# Patient Record
Sex: Female | Born: 1990 | Hispanic: No | Marital: Single | Smoking: Never smoker
Health system: Southern US, Community
[De-identification: ages and names within clinical notes are randomized; demographics above are authoritative.]

## PROBLEM LIST (undated history)

## (undated) ENCOUNTER — Inpatient Hospital Stay (HOSPITAL_COMMUNITY): Payer: Managed Care, Other (non HMO)

---
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
## 2012-10-11 ENCOUNTER — Encounter: Payer: Managed Care, Other (non HMO) | Admitting: Family Medicine

## 2012-10-14 ENCOUNTER — Ambulatory Visit (INDEPENDENT_AMBULATORY_CARE_PROVIDER_SITE_OTHER): Payer: Managed Care, Other (non HMO) | Admitting: Family Medicine

## 2012-10-14 ENCOUNTER — Encounter: Payer: Self-pay | Admitting: Family Medicine

## 2012-10-14 ENCOUNTER — Encounter: Payer: Managed Care, Other (non HMO) | Admitting: Family Medicine

## 2012-10-14 VITALS — BP 100/68 | HR 88 | Temp 99.1°F | Ht 68.75 in | Wt 169.4 lb

## 2012-10-14 DIAGNOSIS — Z3009 Encounter for other general counseling and advice on contraception: Secondary | ICD-10-CM

## 2012-10-14 DIAGNOSIS — Z9189 Other specified personal risk factors, not elsewhere classified: Secondary | ICD-10-CM

## 2012-10-14 DIAGNOSIS — Z202 Contact with and (suspected) exposure to infections with a predominantly sexual mode of transmission: Secondary | ICD-10-CM

## 2012-10-14 DIAGNOSIS — Z0000 Encounter for general adult medical examination without abnormal findings: Secondary | ICD-10-CM

## 2012-10-14 DIAGNOSIS — Z30011 Encounter for initial prescription of contraceptive pills: Secondary | ICD-10-CM

## 2012-10-14 DIAGNOSIS — K130 Diseases of lips: Secondary | ICD-10-CM

## 2012-10-14 DIAGNOSIS — G4700 Insomnia, unspecified: Secondary | ICD-10-CM | POA: Insufficient documentation

## 2012-10-14 MED ORDER — NORGESTIMATE-ETH ESTRADIOL 0.25-35 MG-MCG PO TABS: 1.0000 | ORAL_TABLET | Freq: Every day | ORAL | Status: DC

## 2012-10-14 NOTE — Progress Notes (Signed)
  Subjective:    Patient ID: Virginia Alvarado, female    DOB: 21-Aug-1990, 22 y.o.   MRN: [id number]  HPI CPE: not sleeping well- trouble falling asleep.  Pt reports she thought she had an appt at 8:30 so was up all night.  Went to bed at 10am and was up at 2pm.  Has not tried anything for sleep.  sxs started ~1 month.  Hyperpigmented areas in corners of mouth- only occurs in July.  Was seen for this last year and told to use clotrimazole.  Didn't feel it was effective.  Only using once daily.     Review of Systems Patient reports no vision/ hearing changes, adenopathy,fever, weight change,  persistant/recurrent hoarseness , swallowing issues, chest pain, palpitations, edema, persistant/recurrent cough, hemoptysis, dyspnea (rest/exertional/paroxysmal nocturnal), gastrointestinal bleeding (melena, rectal bleeding), abdominal pain, significant heartburn, bowel changes, GU symptoms (dysuria, hematuria, incontinence), Gyn symptoms (abnormal  bleeding, pain),  syncope, focal weakness, memory loss, numbness & tingling, skin/hair/nail changes, abnormal bruising or bleeding, anxiety, or depression.     Objective:   Physical Exam  General Appearance:    Alert, cooperative, no distress, appears stated age  Head:    Normocephalic, without obvious abnormality, atraumatic  Eyes:    PERRL, conjunctiva/corneas clear, EOM's intact, fundi    benign, both eyes  Ears:    Normal TM's and external ear canals, both ears  Nose:   Nares normal, septum midline, mucosa normal, no drainage    or sinus tenderness  Throat:   Angular cheilits, mucosa and tongue normal; teeth and gums normal  Neck:   Supple, symmetrical, trachea midline, no adenopathy;    Thyroid: no enlargement/tenderness/nodules  Back:     Symmetric, no curvature, ROM normal, no CVA tenderness  Lungs:     Clear to auscultation bilaterally, respirations unlabored  Chest Wall:    No tenderness or deformity   Heart:    Regular rate and rhythm, S1 and S2  normal, no murmur, rub   or gallop  Breast Exam:    No tenderness, masses, or nipple abnormality  Abdomen:     Soft, non-tender, bowel sounds active all four quadrants,    no masses, no organomegaly  Genitalia:    deferred  Rectal:    Extremities:   Extremities normal, atraumatic, no cyanosis or edema  Pulses:   2+ and symmetric all extremities  Skin:   Skin color, texture, turgor normal, no rashes or lesions  Lymph nodes:   Cervical, supraclavicular, and axillary nodes normal  Neurologic:   CNII-XII intact, normal strength, sensation and reflexes    throughout          Assessment & Plan:

## 2012-10-14 NOTE — Assessment & Plan Note (Signed)
New.  Reviewed appropriate start date and importance of taking pills regularly.

## 2012-10-14 NOTE — Assessment & Plan Note (Signed)
Pt's PE WNL.  Check labs including those for STD exposure.  Cautioned pt to use condoms w/ each encounter.  Anticipatory guidance provided.

## 2012-10-14 NOTE — Patient Instructions (Addendum)
Follow up in 1 year or as needed We'll notify you of your lab results and make any changes if needed Start Melatonin as directed for your insomnia- this will help regulate the sleep/wake cycle Start the birth control pills the Sunday after you next bleed Use the Clotrimazole twice daily Call with any questions or concerns Good Luck in School!

## 2012-10-14 NOTE — Assessment & Plan Note (Signed)
Reviewed importance of antifungal use twice daily

## 2012-10-14 NOTE — Assessment & Plan Note (Signed)
New.  Start melatonin as it seems that pt's sleep/wake cycle is off schedule.  Will follow.

## 2012-10-15 LAB — HEPATIC FUNCTION PANEL
Albumin: 4.4 g/dL (ref 3.5–5.2)
Alkaline Phosphatase: 50 U/L (ref 39–117)
Total Bilirubin: 0.7 mg/dL (ref 0.3–1.2)

## 2012-10-15 LAB — LIPID PANEL
HDL: 64.6 mg/dL (ref >39.00)
Total CHOL/HDL Ratio: 2
Triglycerides: 65 mg/dL (ref 0.0–149.0)
VLDL: 13 mg/dL (ref 0.0–40.0)

## 2012-10-15 LAB — GC/CHLAMYDIA PROBE AMP, URINE
Chlamydia, Swab/Urine, PCR: POSITIVE — AB
GC Probe Amp, Urine: NEGATIVE

## 2012-10-15 LAB — CBC WITH DIFFERENTIAL/PLATELET
Basophils Absolute: 0 10*3/uL (ref 0.0–0.1)
Eosinophils Absolute: 0.1 10*3/uL (ref 0.0–0.7)
Eosinophils Relative: 2.7 % (ref 0.0–5.0)
HCT: 36.5 % (ref 36.0–46.0)
Lymphs Abs: 1.8 10*3/uL (ref 0.7–4.0)
MCHC: 31.8 g/dL (ref 30.0–36.0)
MCV: 82 fl (ref 78.0–100.0)
Monocytes Absolute: 0.2 10*3/uL (ref 0.1–1.0)
Neutrophils Relative %: 57.3 % (ref 43.0–77.0)
Platelets: 252 10*3/uL (ref 150.0–400.0)
RDW: 15 % — ABNORMAL HIGH (ref 11.5–14.6)

## 2012-10-15 LAB — BASIC METABOLIC PANEL
Calcium: 9.7 mg/dL (ref 8.4–10.5)
GFR: 83.29 mL/min (ref >60.00)
Glucose, Bld: 85 mg/dL (ref 70–99)
Potassium: 3.8 mEq/L (ref 3.5–5.1)
Sodium: 136 mEq/L (ref 135–145)

## 2012-10-15 LAB — RPR

## 2012-10-18 ENCOUNTER — Telehealth: Payer: Self-pay | Admitting: Family Medicine

## 2012-10-18 MED ORDER — AZITHROMYCIN 500 MG PO TABS: 1000.0000 mg | ORAL_TABLET | Freq: Once | ORAL | Status: DC

## 2012-10-18 NOTE — Telephone Encounter (Signed)
Message copied by Verdie Shire on Fri Oct 18, 2012  5:01 PM ------      Message from: Sheliah Hatch      Created: Tue Oct 15, 2012  9:13 PM       Labs look good w/ exception of + chlamydia.  Needs Azithromycin 500mg - 2 tabs at same time (1000mg  total) x1 dose.  Needs to avoid sex x7 days and partner needs to be notified.  Please report to health dept ------

## 2012-10-18 NOTE — Telephone Encounter (Signed)
Spoke with the pt and informed her of recent lab results and note.  Pt understood and agreed.  New rx sent to the pharmacy by e-script.  Reported to Health Dept.//AB/CMA

## 2012-10-18 NOTE — Telephone Encounter (Signed)
Patient says that she is returning a missed call from Angie in regards to her lab results.

## 2012-11-15 ENCOUNTER — Telehealth: Payer: Self-pay | Admitting: General Practice

## 2012-11-15 NOTE — Telephone Encounter (Signed)
Pt called stating that her birth control is causing severe eczema outbreaks and is having painful abdominal cramping. Med removed from Rx list per pt preference.

## 2012-12-06 ENCOUNTER — Telehealth: Payer: Self-pay | Admitting: Family Medicine

## 2012-12-06 NOTE — Telephone Encounter (Signed)
Refill: Sprintec 28 day tablet 0.25-35 mg-mcg. 90 day supply.

## 2012-12-10 NOTE — Telephone Encounter (Signed)
Pt was having side effects from this medication. Medication was removed from patient medication list per patient request.  Left message on VM to return call to  Clarify that she is no longer taking medication. SW, CMA

## 2013-01-23 ENCOUNTER — Other Ambulatory Visit: Payer: Self-pay

## 2013-02-27 ENCOUNTER — Encounter: Payer: Self-pay | Admitting: Internal Medicine

## 2013-02-27 ENCOUNTER — Ambulatory Visit (INDEPENDENT_AMBULATORY_CARE_PROVIDER_SITE_OTHER): Payer: Managed Care, Other (non HMO) | Admitting: Internal Medicine

## 2013-02-27 VITALS — BP 185/85 | HR 104 | Temp 98.0°F | Resp 14 | Wt 170.4 lb

## 2013-02-27 DIAGNOSIS — R6883 Chills (without fever): Secondary | ICD-10-CM

## 2013-02-27 DIAGNOSIS — R05 Cough: Secondary | ICD-10-CM

## 2013-02-27 DIAGNOSIS — J209 Acute bronchitis, unspecified: Secondary | ICD-10-CM

## 2013-02-27 MED ORDER — HYDROCODONE-HOMATROPINE 5-1.5 MG/5ML PO SYRP: 5.0000 mL | ORAL_SOLUTION | Freq: Four times a day (QID) | ORAL | Status: DC | PRN

## 2013-02-27 MED ORDER — AZITHROMYCIN 250 MG PO TABS: ORAL_TABLET | ORAL | Status: DC

## 2013-02-27 NOTE — Progress Notes (Signed)
   Subjective:    Patient ID: Virginia Alvarado, female    DOB: 12-19-1990, 22 y.o.   MRN: [id number]  HPI   Symptoms began 02/18/13 as marked weakness and fatigue associated with chills and sweats. She is unsure as to whether she had any significant fever .As of 12/4 she developed a cough productive of yellow sputum. This was associated with marked hypersomnolence, arthralgias, and myalgias  She used Robitussin-DM and NyQuil w/o benefit.  Subsequently the cough has become nonproductive. She may have had some wheezing w/o dyspnea. She's had some dental pain.  She has no history of asthma and has never smoked.   Review of Systems She denies extrinsic symptoms of itchy, watery eyes, sneezing  She's had no frontal sinus pain, facial pain, nasal purulence, sore throat, otic pain, or otic discharge.       Objective:   Physical Exam  General appearance:good health ;well nourished; no acute distress or increased work of breathing is present.  No  lymphadenopathy about the head, neck, or axilla noted.   Eyes: No conjunctival inflammation or lid edema is present. There is no scleral icterus.  Ears:  External ear exam shows no significant lesions or deformities.  Otoscopic examination reveals clear canals, tympanic membranes are intact bilaterally without bulging, retraction, inflammation or discharge.  Nose:  External nasal examination shows no deformity or inflammation. Nasal mucosa are pink and moist without lesions or exudates. No septal dislocation or deviation.No obstruction to airflow.   Oral exam: Dental hygiene is good; lips and gums are healthy appearing.There is no oropharyngeal erythema or exudate noted.   Neck:  No deformities,  masses, or tenderness noted.      Heart:  Slight increased  rate and regular rhythm. S1 and S2 normal without gallop, murmur, click, rub or other extra sounds.  BP RECHECK 120/80  Lungs:Chest clear to auscultation; no wheezes, rhonchi,rales ,or rubs  present.No increased work of breathing.  Dry cough  Extremities:  No cyanosis, edema, or clubbing  noted    Skin: Slightly damp         Assessment & Plan:  #1 acute bronchitis w/o bronchospasm  Plan: See orders and recommendations

## 2013-02-27 NOTE — Patient Instructions (Signed)
  Followup as needed for your this acute issue. Please report any significant change in your symptoms. Carry room temperature water and sip liberally after coughing.

## 2013-02-27 NOTE — Addendum Note (Signed)
Addended by: Wende Mott on: 02/27/2013 03:50 PM   Modules accepted: Orders

## 2013-02-27 NOTE — Progress Notes (Signed)
Pre visit review using our clinic review tool, if applicable. No additional management support is needed unless otherwise documented below in the visit note. 

## 2013-03-27 ENCOUNTER — Ambulatory Visit (INDEPENDENT_AMBULATORY_CARE_PROVIDER_SITE_OTHER): Payer: Managed Care, Other (non HMO) | Admitting: Family Medicine

## 2013-03-27 ENCOUNTER — Encounter: Payer: Self-pay | Admitting: Family Medicine

## 2013-03-27 VITALS — BP 130/82 | HR 83 | Temp 98.2°F | Resp 17 | Wt 168.0 lb

## 2013-03-27 DIAGNOSIS — L309 Dermatitis, unspecified: Secondary | ICD-10-CM

## 2013-03-27 DIAGNOSIS — R059 Cough, unspecified: Secondary | ICD-10-CM

## 2013-03-27 DIAGNOSIS — R05 Cough: Secondary | ICD-10-CM

## 2013-03-27 DIAGNOSIS — R058 Other specified cough: Secondary | ICD-10-CM

## 2013-03-27 DIAGNOSIS — L259 Unspecified contact dermatitis, unspecified cause: Secondary | ICD-10-CM

## 2013-03-27 MED ORDER — PROMETHAZINE-DM 6.25-15 MG/5ML PO SYRP: 5.0000 mL | ORAL_SOLUTION | Freq: Four times a day (QID) | ORAL | Status: DC | PRN

## 2013-03-27 MED ORDER — BENZONATATE 200 MG PO CAPS: 200.0000 mg | ORAL_CAPSULE | Freq: Three times a day (TID) | ORAL | Status: DC | PRN

## 2013-03-27 MED ORDER — BETAMETHASONE VALERATE 0.1 % EX OINT: 1.0000 "application " | TOPICAL_OINTMENT | Freq: Two times a day (BID) | CUTANEOUS | Status: DC

## 2013-03-27 MED ORDER — PREDNISONE 10 MG PO TABS: ORAL_TABLET | ORAL | Status: DC

## 2013-03-27 NOTE — Progress Notes (Signed)
   Subjective:    Patient ID: Virginia Alvarado, female    DOB: 06/03/1990, 23 y.o.   MRN: [id number]  HPI URI- was tx'd on 12/11 for bronchitis.  Has had persistent cough since.  Pt reports feeling 'ok' w/ exception of cough.  Cough sounds like 'barking seal'.  Cough is rarely productive of mucous.  No fevers.  Taking echinacea, Vit C, tylenol severe cold.  No facial pain/pressure, sore throat, ear pain.  No known sick contacts.  Eczema- pt has been using triamcinolone 'since i was 9' w/out relief.     Review of Systems For ROS see HPI     Objective:   Physical Exam  Constitutional: She appears well-developed and well-nourished. No distress.  HENT:  Head: Normocephalic and atraumatic.  TMs normal bilaterally Mild nasal congestion Throat w/out erythema, edema, or exudate  Eyes: Conjunctivae and EOM are normal. Pupils are equal, round, and reactive to light.  Neck: Normal range of motion. Neck supple.  Cardiovascular: Normal rate, regular rhythm, normal heart sounds and intact distal pulses.   No murmur heard. Pulmonary/Chest: Effort normal and breath sounds normal. No respiratory distress. She has no wheezes.  + hacking cough  Lymphadenopathy:    She has no cervical adenopathy.  Skin: Skin is warm and dry.  Scattered eczematous patches          Assessment & Plan:

## 2013-03-27 NOTE — Assessment & Plan Note (Signed)
Chronic problem.  Deteriorated.  Increase potency of steroid ointment but cautioned against use on eyelids.  Reviewed supportive care and red flags that should prompt return.  Pt expressed understanding and is in agreement w/ plan.

## 2013-03-27 NOTE — Assessment & Plan Note (Signed)
New.  No evidence of infectious process.  Start prednisone for airway inflammation and cough meds prn.  Reviewed supportive care and red flags that should prompt return.  Pt expressed understanding and is in agreement w/ plan.

## 2013-03-27 NOTE — Patient Instructions (Signed)
Follow up as needed The cough is not infectious- it's caused by airway inflammation from your recent infection Drink plenty of fluids Use the cough pills for daytime and the syrup for night Take the prednisone as directed- take w/ food Use the Triamcinolone on the eyelids and the betamethasone on other areas Call with any questions or concerns Hang in there! Happy Belated Birthday!!!

## 2013-03-27 NOTE — Progress Notes (Signed)
Pre visit review using our clinic review tool, if applicable. No additional management support is needed unless otherwise documented below in the visit note. 

## 2013-07-04 ENCOUNTER — Ambulatory Visit (INDEPENDENT_AMBULATORY_CARE_PROVIDER_SITE_OTHER): Payer: Managed Care, Other (non HMO) | Admitting: *Deleted

## 2013-07-04 DIAGNOSIS — Z111 Encounter for screening for respiratory tuberculosis: Secondary | ICD-10-CM

## 2013-07-04 NOTE — Progress Notes (Signed)
PPD Placement note Jones SkeneAbiola Recktenwald, [age] y.o. female is here today for placement of PPD test Reason for PPD test: School Pt taken PPD test before: Yes Verified in allergy area and with patient that they are not allergic to the products PPD is made of (Phenol or Tween). Verified, no known allergies Is patient taking any oral or IV steroid medication now or have they taken it in the last month? No Has the patient ever received the BCG vaccine?: No Has the patient been in recent contact with anyone known or suspected of having active TB disease?: No      Date of exposure (if applicable):N/A      Name of person they were exposed to (if applicable): N/A Patient's [place] of origin[place] O: Alert and oriented in NAD. P:  PPD placed on 07/04/2013.  Patient advised to return for reading within 48-72 hours.

## 2013-07-07 ENCOUNTER — Ambulatory Visit (HOSPITAL_BASED_OUTPATIENT_CLINIC_OR_DEPARTMENT_OTHER)
Admission: RE | Admit: 2013-07-07 | Discharge: 2013-07-07 | Disposition: A | Discharge status: Home / Self Care | Payer: Managed Care, Other (non HMO) | Source: Ambulatory Visit | Attending: Family Medicine | Admitting: Family Medicine

## 2013-07-07 ENCOUNTER — Other Ambulatory Visit: Payer: Self-pay | Admitting: Family Medicine

## 2013-07-07 DIAGNOSIS — R7611 Nonspecific reaction to tuberculin skin test without active tuberculosis: Secondary | ICD-10-CM

## 2013-07-07 LAB — TB SKIN TEST
Induration: 5 mm
TB Skin Test: POSITIVE

## 2013-08-06 ENCOUNTER — Ambulatory Visit (INDEPENDENT_AMBULATORY_CARE_PROVIDER_SITE_OTHER): Payer: Managed Care, Other (non HMO) | Admitting: Family Medicine

## 2013-08-06 ENCOUNTER — Encounter: Payer: Self-pay | Admitting: Family Medicine

## 2013-08-06 VITALS — BP 110/80 | HR 110 | Temp 98.0°F | Resp 16 | Wt 171.2 lb

## 2013-08-06 DIAGNOSIS — L309 Dermatitis, unspecified: Secondary | ICD-10-CM

## 2013-08-06 DIAGNOSIS — L919 Hypertrophic disorder of the skin, unspecified: Secondary | ICD-10-CM

## 2013-08-06 DIAGNOSIS — L909 Atrophic disorder of skin, unspecified: Secondary | ICD-10-CM

## 2013-08-06 DIAGNOSIS — L918 Other hypertrophic disorders of the skin: Secondary | ICD-10-CM

## 2013-08-06 DIAGNOSIS — L242 Irritant contact dermatitis due to solvents: Secondary | ICD-10-CM | POA: Insufficient documentation

## 2013-08-06 DIAGNOSIS — L259 Unspecified contact dermatitis, unspecified cause: Secondary | ICD-10-CM

## 2013-08-06 MED ORDER — TRIAMCINOLONE ACETONIDE 0.1 % EX OINT: TOPICAL_OINTMENT | Freq: Two times a day (BID) | CUTANEOUS | Status: DC

## 2013-08-06 NOTE — Progress Notes (Signed)
   Subjective:    Patient ID: Virginia SkeneAbiola Alvarado, female    DOB: 12/11/1990, 23 y.o.   MRN: [id number]  HPI Bumps- pt reports bumps are present on inner thighs bilaterally, 'butt', lower abdomen, breasts.  Bumps on inner thighs present x6 weeks, other bumps appeared 2 weeks ago.  Not painful.  Not itchy.  The bumps in groin have increased in size.  No new sexual partners.  Started using Darene Lamerair recently.   Review of Systems For ROS see HPI     Objective:   Physical Exam  Vitals reviewed. Constitutional: She appears well-developed and well-nourished. No distress.  Skin: Skin is warm and dry.  Eczematous patch of R breast, lateral to nipple Scabs clustered over mons pubis Clustered skin tags in R anogenital crease          Assessment & Plan:

## 2013-08-06 NOTE — Progress Notes (Signed)
Pre visit review using our clinic review tool, if applicable. No additional management support is needed unless otherwise documented below in the visit note. 

## 2013-08-06 NOTE — Patient Instructions (Signed)
Follow up as needed We'll call you with your GYN appt for the skin tag removal Apply the Triamcinolone ointment twice daily on the eczema until area has improved (you can also use this on the Nair scabs) Do not repeat Darene LamerNair treatment until area has completely healed- and if you do use it again, do not leave it on as long Call with any questions or concerns Hang in there!!!

## 2013-09-11 ENCOUNTER — Ambulatory Visit (INDEPENDENT_AMBULATORY_CARE_PROVIDER_SITE_OTHER): Payer: Managed Care, Other (non HMO) | Admitting: Gynecology

## 2013-09-11 ENCOUNTER — Other Ambulatory Visit (HOSPITAL_COMMUNITY)
Admission: RE | Admit: 2013-09-11 | Discharge: 2013-09-11 | Disposition: A | Discharge status: Home / Self Care | Payer: Managed Care, Other (non HMO) | Source: Ambulatory Visit | Attending: Gynecology | Admitting: Gynecology

## 2013-09-11 ENCOUNTER — Encounter: Payer: Self-pay | Admitting: Gynecology

## 2013-09-11 ENCOUNTER — Other Ambulatory Visit: Payer: Self-pay | Admitting: Gynecology

## 2013-09-11 ENCOUNTER — Telehealth: Payer: Self-pay

## 2013-09-11 VITALS — BP 116/74 | Ht 68.75 in | Wt 170.8 lb

## 2013-09-11 DIAGNOSIS — N9089 Other specified noninflammatory disorders of vulva and perineum: Secondary | ICD-10-CM

## 2013-09-11 DIAGNOSIS — Z124 Encounter for screening for malignant neoplasm of cervix: Secondary | ICD-10-CM

## 2013-09-11 DIAGNOSIS — Z01419 Encounter for gynecological examination (general) (routine) without abnormal findings: Secondary | ICD-10-CM | POA: Insufficient documentation

## 2013-09-11 MED ORDER — PODOFILOX 0.5 % EX GEL: Freq: Two times a day (BID) | CUTANEOUS | Status: DC

## 2013-09-11 NOTE — Telephone Encounter (Signed)
Left message for patient to call me

## 2013-09-11 NOTE — Patient Instructions (Signed)
Molluscum Contagiosum Molluscum contagiosum is a viral infection of the skin that causes smooth surfaced, firm, small (3 to 5 mm), dome-shaped bumps (papules) which are flesh-colored. The bumps usually do not hurt or itch. In children, they most often appear on the face, trunk, arms and legs. In adults, the growths are commonly found on the genitals, thighs, face, neck, and belly (abdomen). The infection may be spread to others by close (skin to skin) contact (such as occurs in schools and swimming pools), sharing towels and clothing, and through sexual contact. The bumps usually disappear without treatment in 2 to 4 months, especially in children. You may have them treated to avoid spreading them. Scraping (curetting) the middle part (central plug) of the bump with a needle or sharp curette, or application of liquid nitrogen for 8 or 9 seconds usually cures the infection. HOME CARE INSTRUCTIONS   Do not scratch the bumps. This may spread the infection to other parts of the body and to other people.  Avoid close contact with others, including sexual contact, until the bumps disappear. Do not share towels or clothing.  If liquid nitrogen was used, blisters will form. Leave the blisters alone and cover with a bandage. The tops will fall off by themselves in 7 to 14 days.  Four months without a lesion is usually a cure. SEEK IMMEDIATE MEDICAL CARE IF:  You have a fever.  You develop swelling, redness, pain, tenderness, or warmth in the areas of the bumps. They may be infected. Document Released: 03/03/2000 Document Revised: 05/29/2011 Document Reviewed: 08/14/2008 ExitCare Patient Information 2015 [employer]. This information is not intended to replace advice given to you by your health care provider. Make sure you discuss any questions you have with your health care provider.  

## 2013-09-11 NOTE — Telephone Encounter (Signed)
You prescribed Condylox Gel for her this morning.  Per Pharmacy it does not come in a generic and will cost patient $300.  The Condylox Solution does come in a generic and will likely be more cost efficient per pharmacist.  Patient wondered if the solution would be ok?

## 2013-09-11 NOTE — Telephone Encounter (Signed)
Solution will be fine. I told patient to wait until we got pathology confirmation?????

## 2013-09-11 NOTE — Addendum Note (Signed)
Addended by: Richardson ChiquitoWILKINSON, KARI S on: 09/11/2013 12:31 PM   Modules accepted: Orders

## 2013-09-11 NOTE — Progress Notes (Signed)
   Patient is a 23 year old who was referred to our practice as a courtesy of her PCP Dr. Beverely Lowabori because of multiple "growth" that she has noted in her middle thighs. Patient has not had her first Pap smear yet. Patient is sexually active and is using condoms. Patient reports normal menstrual cycle. Patient with past history of Chlamydia infection. Patient otherwise healthy. She does suffer from eczema.  Exam: External genitalia: Multiple papillomatous-like growths bilateral medial thighs and near inguinal crease and mons pubis. Vagina: No lesions or discharge Cervix: No lesions or discharge  2 of the largest areas from the right medial thigh were biopsied in the following fashion. The area was cleansed with Betadine solution. One percent lidocaine was infiltrated subcutaneously at the base of these 2 lesions. With a scalpel the lesions were removed and submitted for histological evaluation separately. For hemostasis overnight her was utilized.  Pap smear without HPV screen was done today.  Assessment/plan: Patient with multiple medial thigh growths highly suspicious for molluscum contagiosum will await pathology report for confirmation. We'll tentatively: A prescription for Condylox (cutoff 1) 0.5% gel that she can apply twice a day for 3 times a week for one month and and followup with me after one month. We will wait for results of pathology before initiating treatment. Pap smear was done today.

## 2013-09-12 ENCOUNTER — Other Ambulatory Visit: Payer: Self-pay | Admitting: Gynecology

## 2013-09-12 LAB — CYTOLOGY - PAP

## 2013-09-12 MED ORDER — PODOFILOX 0.5 % EX SOLN: CUTANEOUS | Status: DC

## 2013-09-12 NOTE — Telephone Encounter (Signed)
Pharmacy informed solution ok.  Patient has NOT picked up Rx.  You sent the Rx electronically and the pharmacy contacted her about these issues with cost, etc.  She knows not to pick it up until pathology confirmed..Marland Kitchen

## 2013-09-12 NOTE — Telephone Encounter (Signed)
ok 

## 2013-09-15 ENCOUNTER — Telehealth: Payer: Self-pay | Admitting: *Deleted

## 2013-09-15 NOTE — Telephone Encounter (Signed)
Pt asked if okay to pick up Condylox Solution, per note pt will need to wait until pathology report return before starting medication. Pt told report not back yet she will wait to her from office.

## 2013-09-17 ENCOUNTER — Other Ambulatory Visit: Payer: Self-pay

## 2013-09-17 ENCOUNTER — Other Ambulatory Visit: Payer: Self-pay | Admitting: Gynecology

## 2013-09-18 ENCOUNTER — Telehealth: Payer: Self-pay | Admitting: Anesthesiology

## 2013-09-18 ENCOUNTER — Other Ambulatory Visit: Payer: Managed Care, Other (non HMO)

## 2013-09-18 NOTE — Telephone Encounter (Signed)
Left message on voice mail for patient to return to office and get labs done... She forgot to stop by the lab when she had her appointment on June 25th.Marland Kitchen.Marland Kitchen..Marland Kitchen

## 2013-09-19 LAB — HIV ANTIBODY (ROUTINE TESTING W REFLEX): HIV 1&2 Ab, 4th Generation: NONREACTIVE

## 2013-10-15 ENCOUNTER — Telehealth: Payer: Self-pay

## 2013-10-15 NOTE — Telephone Encounter (Signed)
Patient informed and rescheduled.

## 2013-10-15 NOTE — Telephone Encounter (Signed)
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 Her period started and she wondered if she should reschedule the appointment?

## 2013-10-15 NOTE — Telephone Encounter (Signed)
She is rescheduled for next week that will be okay

## 2013-10-16 ENCOUNTER — Ambulatory Visit: Payer: Managed Care, Other (non HMO) | Admitting: Gynecology

## 2013-10-28 ENCOUNTER — Ambulatory Visit (INDEPENDENT_AMBULATORY_CARE_PROVIDER_SITE_OTHER): Payer: Managed Care, Other (non HMO) | Admitting: Gynecology

## 2013-10-28 ENCOUNTER — Encounter: Payer: Self-pay | Admitting: Gynecology

## 2013-10-28 VITALS — BP 132/80

## 2013-10-28 DIAGNOSIS — B081 Molluscum contagiosum: Secondary | ICD-10-CM

## 2013-10-28 NOTE — Progress Notes (Signed)
   Patient is a 23 year old who was referred to our practice as a courtesy of her PCP Dr. Beverely Lowabori on 09/11/2013 because of multiple "growth" that she has noted in her middle thighs. Patient has not had her first Pap smear yet. Patient is sexually active and is using condoms. Patient reports normal menstrual cycle. Patient with past history of Chlamydia infection. Patient otherwise healthy. She does suffer from eczema.  Her examination during that office had demonstrated Multiple papillomatous-like growths bilateral medial thighs and near inguinal crease and mons pubis.of the largest areas from the right medial thigh were biopsied in the following fashion. The area was cleansed with Betadine solution. One percent lidocaine was infiltrated subcutaneously at the base of these 2 lesions. With a scalpel the lesions were removed and submitted for histological evaluation separately. Pre-biopsy differential included molluscum contagiosum versus melanoma.  Patient was started on Condylox 0.05% liquid solution which she applied 3 times a week for one month and was to followup with me one month later which is today. Her pathology report confirmed the initial diagnosis as follows:  Diagnosis 1. Skin , right medial upper thigh MOLLUSCUM CONTAGIOSUM, INFLAMED 2. Skin , right medial lower thigh MOLLUSCUM CONTAGIOSUM, INFLAMED  Her Pap smear:  Diagnosis NEGATIVE FOR INTRAEPITHELIAL LESIONS OR MALIGNANCY.  Exam today: Both medial thigh regions as well as inguinal regions and mons pubis completely healed from the molluscum contagiosum.  Assessment/plan: Patient status post biopsy and treatment for molluscum contagiosum areas completely healed. Patient will return back to her PCP Dr. Beverely Lowabori for her medical care. We will send her a copy of this note.

## 2013-12-15 ENCOUNTER — Ambulatory Visit (INDEPENDENT_AMBULATORY_CARE_PROVIDER_SITE_OTHER): Payer: Managed Care, Other (non HMO) | Admitting: Family Medicine

## 2013-12-15 ENCOUNTER — Encounter: Payer: Self-pay | Admitting: Family Medicine

## 2013-12-15 VITALS — BP 110/72 | HR 77 | Temp 98.2°F | Resp 16 | Wt 165.0 lb

## 2013-12-15 DIAGNOSIS — L259 Unspecified contact dermatitis, unspecified cause: Secondary | ICD-10-CM

## 2013-12-15 DIAGNOSIS — L309 Dermatitis, unspecified: Secondary | ICD-10-CM

## 2013-12-15 MED ORDER — MOMETASONE FUROATE 0.1 % EX OINT: TOPICAL_OINTMENT | Freq: Every day | CUTANEOUS | Status: DC

## 2013-12-15 NOTE — Progress Notes (Signed)
   Subjective:    Patient ID: Virginia Alvarado, female    DOB: [birth year], 23 y.o.   MRN: [id number]  HPI Rash- noted on forehead, underarms, flexor creases of elbows, neck and dorsum of L hand.  Behind ears bilaterally.  Some itching.  No relief w/ Triamcinolone despite using multiple times daily.  Last used 2-3 weeks ago.  Areas dramatically worsened after she stopped using steroid ointment.   Review of Systems For ROS see HPI     Objective:   Physical Exam  Vitals reviewed. Constitutional: She appears well-developed and well-nourished. No distress.  Skin: Skin is warm and dry.  Thickened hyperpigmented eczematous patches noted under arms bilaterally, flexor creases, along hair line and dorsum of L hand          Assessment & Plan:

## 2013-12-15 NOTE — Progress Notes (Signed)
Pre visit review using our clinic review tool, if applicable. No additional management support is needed unless otherwise documented below in the visit note. 

## 2013-12-15 NOTE — Assessment & Plan Note (Signed)
Deteriorated.  No longer having relief w/ Triamcinolone ointment.  Will increase the potency to mometasone.  Reviewed supportive care and red flags that should prompt return.  Pt expressed understanding and is in agreement w/ plan.

## 2013-12-15 NOTE — Patient Instructions (Signed)
Follow up as needed Use the Mometasone ointment twice daily on the dry, dark patches Apply Eucerin lotion (available over the counter) to rest of skin Call with any questions or concerns Hang in there!

## 2015-01-27 ENCOUNTER — Other Ambulatory Visit (HOSPITAL_COMMUNITY)
Admission: RE | Admit: 2015-01-27 | Discharge: 2015-01-27 | Disposition: A | Discharge status: Home / Self Care | Payer: Managed Care, Other (non HMO) | Source: Ambulatory Visit | Attending: Family Medicine | Admitting: Family Medicine

## 2015-01-27 ENCOUNTER — Encounter: Payer: Self-pay | Admitting: Family Medicine

## 2015-01-27 ENCOUNTER — Ambulatory Visit (INDEPENDENT_AMBULATORY_CARE_PROVIDER_SITE_OTHER): Payer: Managed Care, Other (non HMO) | Admitting: Family Medicine

## 2015-01-27 VITALS — BP 110/78 | HR 100 | Temp 98.2°F | Resp 16 | Wt 175.5 lb

## 2015-01-27 DIAGNOSIS — N760 Acute vaginitis: Secondary | ICD-10-CM | POA: Diagnosis not present

## 2015-01-27 MED ORDER — FLUCONAZOLE 150 MG PO TABS: 150.0000 mg | ORAL_TABLET | Freq: Once | ORAL | Status: DC

## 2015-01-27 NOTE — Assessment & Plan Note (Signed)
New.  Pt's sxs and PE consistent w/ yeast.  Wet prep collected for confirmation.  Start Diflucan, treat additional issues if needed.  Pt expressed understanding and is in agreement w/ plan.

## 2015-01-27 NOTE — Progress Notes (Signed)
   Subjective:    Patient ID: Virginia Alvarado, female    DOB: 10/17/1990, 24 y.o.   MRN: [id number]  HPI Vaginitis- pt began having vaginal itching 2-3 days ago.  + thick, white d/c.  No burning w/ urination.  No odor to discharge.  Hx of similar.   Review of Systems For ROS see HPI     Objective:   Physical Exam  Constitutional: She is oriented to person, place, and time. She appears well-developed and well-nourished. No distress.  HENT:  Head: Normocephalic and atraumatic.  Genitourinary: Vaginal discharge (thick, white, chunky discharge) found.  Neurological: She is alert and oriented to person, place, and time.  Skin: Skin is warm and dry.  Psychiatric: She has a normal mood and affect. Her behavior is normal. Thought content normal.  Vitals reviewed.         Assessment & Plan:

## 2015-01-27 NOTE — Progress Notes (Signed)
Pre visit review using our clinic review tool, if applicable. No additional management support is needed unless otherwise documented below in the visit note. 

## 2015-01-27 NOTE — Patient Instructions (Signed)
Follow up as needed Take the Diflucan x1 dose- this should treat the yeast Call with any questions or concerns If you want to join us at the new [place] office, any scheduled appointments will automatically transfer and we will see you at [address] [place] Alvarado, [place], [place] [id number]  Happy Holidays!!!

## 2015-01-29 ENCOUNTER — Other Ambulatory Visit: Payer: Self-pay | Admitting: General Practice

## 2015-01-29 LAB — CERVICOVAGINAL ANCILLARY ONLY: WET PREP (BD AFFIRM): POSITIVE — AB

## 2015-01-29 MED ORDER — METRONIDAZOLE 500 MG PO TABS: 500.0000 mg | ORAL_TABLET | Freq: Two times a day (BID) | ORAL | Status: AC

## 2015-03-08 ENCOUNTER — Other Ambulatory Visit: Payer: Self-pay | Admitting: General Practice

## 2015-03-08 ENCOUNTER — Telehealth: Payer: Self-pay | Admitting: Family Medicine

## 2015-03-08 MED ORDER — METRONIDAZOLE 500 MG PO TABS: 500.0000 mg | ORAL_TABLET | Freq: Three times a day (TID) | ORAL | Status: DC

## 2015-03-08 NOTE — Telephone Encounter (Signed)
This medication was sent again today.

## 2015-03-08 NOTE — Telephone Encounter (Signed)
Relation to XB:JYNWpt:self Call back number:[phone] Pharmacy: [id number] [place] - [address] [phone] (Phone) [phone])         Reason for call:  Patient states pharmacy never received metroNIDAZOLE (FLAGYL) 500 MG tablet

## 2015-04-28 ENCOUNTER — Encounter: Payer: Self-pay | Admitting: Family Medicine

## 2015-04-29 ENCOUNTER — Encounter: Payer: Self-pay | Admitting: Family Medicine

## 2015-04-29 ENCOUNTER — Ambulatory Visit (INDEPENDENT_AMBULATORY_CARE_PROVIDER_SITE_OTHER): Payer: Managed Care, Other (non HMO) | Admitting: Family Medicine

## 2015-04-29 VITALS — BP 116/80 | HR 95 | Temp 98.3°F | Ht 69.5 in | Wt 174.0 lb

## 2015-04-29 DIAGNOSIS — N760 Acute vaginitis: Secondary | ICD-10-CM | POA: Diagnosis not present

## 2015-04-29 MED ORDER — NYSTATIN 100000 UNIT/GM EX OINT: 1.0000 "application " | TOPICAL_OINTMENT | Freq: Two times a day (BID) | CUTANEOUS | Status: AC

## 2015-04-29 NOTE — Patient Instructions (Signed)
Follow up by phone or MyChart on Monday if no improvement Apply the Nystatin ointment twice daily until symptoms improve Try and keep area clean and dry- avoid moisture as much as possible! Call with any questions or concerns Hang in there!!!

## 2015-04-29 NOTE — Assessment & Plan Note (Signed)
Recurrent issue for pt.  She appears to have external yeast or contact dermatitis.  Start topical antifungal.  Reviewed need for area to remain clean and dry.  If no improvement, will need referral to GYN.  Pt expressed understanding and is in agreement w/ plan.

## 2015-04-29 NOTE — Progress Notes (Signed)
   Subjective:    Patient ID: Virginia Alvarado, female    DOB: 1991-02-14, 25 y.o.   MRN: [id number]  HPI Vaginal itching and burning- sxs are external rather than internal.  Some sx relief w/ ice, sitting in front of fan.  No discharge.  + swelling.  No redness.  sxs started 3 weeks ago- initially improved but then dramatically worsened.  No new sexual partners.  Using [employer].  No wet or sweaty clothes.  Review of Systems For ROS see HPI     Objective:   Physical Exam  Constitutional: She is oriented to person, place, and time. She appears well-developed and well-nourished. No distress.  Genitourinary: There is tenderness and injury (excoriation between labia majora/minora) on the right labia. There is tenderness on the left labia. No bleeding in the vagina. No vaginal discharge found.  Labia minora/majora swelling w/ TTP  Neurological: She is alert and oriented to person, place, and time.  Skin: Skin is warm and dry. There is erythema (labial erythema).  Vitals reviewed.         Assessment & Plan:

## 2015-04-29 NOTE — Progress Notes (Signed)
Pre visit review using our clinic review tool, if applicable. No additional management support is needed unless otherwise documented below in the visit note. 

## 2015-05-03 ENCOUNTER — Encounter: Payer: Self-pay | Admitting: Family Medicine

## 2015-05-07 ENCOUNTER — Telehealth: Payer: Self-pay | Admitting: *Deleted

## 2015-05-07 ENCOUNTER — Ambulatory Visit (INDEPENDENT_AMBULATORY_CARE_PROVIDER_SITE_OTHER): Payer: Managed Care, Other (non HMO) | Admitting: Family Medicine

## 2015-05-07 ENCOUNTER — Other Ambulatory Visit: Payer: Self-pay | Admitting: Family Medicine

## 2015-05-07 ENCOUNTER — Encounter: Payer: Self-pay | Admitting: Family Medicine

## 2015-05-07 VITALS — BP 120/88 | HR 69 | Temp 97.2°F | Ht 70.0 in | Wt 175.6 lb

## 2015-05-07 DIAGNOSIS — Z0000 Encounter for general adult medical examination without abnormal findings: Secondary | ICD-10-CM

## 2015-05-07 LAB — HEPATIC FUNCTION PANEL
ALT: 17 U/L (ref 0–35)
AST: 20 U/L (ref 0–37)
Albumin: 4.5 g/dL (ref 3.5–5.2)
Alkaline Phosphatase: 48 U/L (ref 39–117)
BILIRUBIN DIRECT: 0 mg/dL (ref 0.0–0.3)
BILIRUBIN TOTAL: 0.3 mg/dL (ref 0.2–1.2)
Total Protein: 7.7 g/dL (ref 6.0–8.3)

## 2015-05-07 LAB — LIPID PANEL
CHOL/HDL RATIO: 2
Cholesterol: 145 mg/dL (ref 0–200)
HDL: 67.5 mg/dL (ref >39.00)
LDL CALC: 65 mg/dL (ref 0–99)
NONHDL: 77.76
TRIGLYCERIDES: 63 mg/dL (ref 0.0–149.0)
VLDL: 12.6 mg/dL (ref 0.0–40.0)

## 2015-05-07 LAB — CBC WITH DIFFERENTIAL/PLATELET
BASOS PCT: 0.3 % (ref 0.0–3.0)
Basophils Absolute: 0 10*3/uL (ref 0.0–0.1)
EOS PCT: 1.8 % (ref 0.0–5.0)
Eosinophils Absolute: 0.1 10*3/uL (ref 0.0–0.7)
HEMATOCRIT: 25.7 % — AB (ref 36.0–46.0)
Lymphocytes Relative: 40.6 % (ref 12.0–46.0)
Lymphs Abs: 2.4 10*3/uL (ref 0.7–4.0)
MCHC: 29.4 g/dL — AB (ref 30.0–36.0)
MCV: 62.7 fl — ABNORMAL LOW (ref 78.0–100.0)
MONO ABS: 0.2 10*3/uL (ref 0.1–1.0)
Monocytes Relative: 3.8 % (ref 3.0–12.0)
NEUTROS ABS: 3.1 10*3/uL (ref 1.4–7.7)
Neutrophils Relative %: 53.5 % (ref 43.0–77.0)
PLATELETS: 419 10*3/uL — AB (ref 150.0–400.0)
RBC: 4.1 Mil/uL (ref 3.87–5.11)
RDW: 19.9 % — AB (ref 11.5–15.5)
WBC: 5.8 10*3/uL (ref 4.0–10.5)

## 2015-05-07 LAB — TSH: TSH: 0.69 u[IU]/mL (ref 0.35–4.50)

## 2015-05-07 LAB — BASIC METABOLIC PANEL
BUN: 9 mg/dL (ref 6–23)
CALCIUM: 9.7 mg/dL (ref 8.4–10.5)
CHLORIDE: 105 meq/L (ref 96–112)
CO2: 25 mEq/L (ref 19–32)
CREATININE: 0.74 mg/dL (ref 0.40–1.20)
GFR: 103.25 mL/min (ref >60.00)
Glucose, Bld: 97 mg/dL (ref 70–99)
Potassium: 4.4 mEq/L (ref 3.5–5.1)
Sodium: 137 mEq/L (ref 135–145)

## 2015-05-07 LAB — VITAMIN D 25 HYDROXY (VIT D DEFICIENCY, FRACTURES): VITD: 17.76 ng/mL — AB (ref 30.00–100.00)

## 2015-05-07 MED ORDER — VITAMIN D (ERGOCALCIFEROL) 1.25 MG (50000 UNIT) PO CAPS: 50000.0000 [IU] | ORAL_CAPSULE | ORAL | Status: AC

## 2015-05-07 MED ORDER — FERROUS SULFATE 325 (65 FE) MG PO TABS: 325.0000 mg | ORAL_TABLET | Freq: Every day | ORAL | Status: AC

## 2015-05-07 NOTE — Assessment & Plan Note (Signed)
Pt's PE WNL.  UTD on GYN.  Check labs.  Anticipatory guidance provided.  

## 2015-05-07 NOTE — Progress Notes (Signed)
   Subjective:    Patient ID: Virginia Alvarado, female    DOB: 1990-08-26, 25 y.o.   MRN: [id number]  HPI CPE- UTD on pap.  No concerns today.   Review of Systems Patient reports no vision/ hearing changes, adenopathy,fever, weight change,  persistant/recurrent hoarseness , swallowing issues, chest pain, palpitations, edema, persistant/recurrent cough, hemoptysis, dyspnea (rest/exertional/paroxysmal nocturnal), gastrointestinal bleeding (melena, rectal bleeding), abdominal pain, significant heartburn, bowel changes, GU symptoms (dysuria, hematuria, incontinence), Gyn symptoms (abnormal  bleeding, pain),  syncope, focal weakness, memory loss, numbness & tingling, skin/hair/nail changes, abnormal bruising or bleeding, anxiety, or depression.     Objective:   Physical Exam General Appearance:    Alert, cooperative, no distress, appears stated age  Head:    Normocephalic, without obvious abnormality, atraumatic  Eyes:    PERRL, conjunctiva/corneas clear, EOM's intact, fundi    benign, both eyes  Ears:    Normal TM's and external ear canals, both ears  Nose:   Nares normal, septum midline, mucosa normal, no drainage    or sinus tenderness  Throat:   Lips, mucosa, and tongue normal; teeth and gums normal  Neck:   Supple, symmetrical, trachea midline, no adenopathy;    Thyroid: no enlargement/tenderness/nodules  Back:     Symmetric, no curvature, ROM normal, no CVA tenderness  Lungs:     Clear to auscultation bilaterally, respirations unlabored  Chest Wall:    No tenderness or deformity   Heart:    Regular rate and rhythm, S1 and S2 normal, no murmur, rub   or gallop  Breast Exam:    Deferred to GYN  Abdomen:     Soft, non-tender, bowel sounds active all four quadrants,    no masses, no organomegaly  Genitalia:    Deferred to GYN  Rectal:    Extremities:   Extremities normal, atraumatic, no cyanosis or edema  Pulses:   2+ and symmetric all extremities  Skin:   Skin color, texture, turgor  normal, no rashes or lesions  Lymph nodes:   Cervical, supraclavicular, and axillary nodes normal  Neurologic:   CNII-XII intact, normal strength, sensation and reflexes    throughout          Assessment & Plan:

## 2015-05-07 NOTE — Patient Instructions (Signed)
Follow up in 1 yr or as needed We'll notify you of your lab results and make any changes if needed Keep up the good work on healthy diet and regular exercise- you look great! Mucinex DM for cough Drink plenty of fluids REST!! Call with any questions or concerns If you want to join [place] at the new [place] office, any scheduled appointments will automatically transfer and we will see you at [address] [place] [address], [place], [place] [address] [facility]) Have a great weekend!!!

## 2015-05-07 NOTE — Progress Notes (Signed)
Pre visit review using our clinic review tool, if applicable. No additional management support is needed unless otherwise documented below in the visit note. 

## 2015-05-07 NOTE — Telephone Encounter (Signed)
Critical value received from [facility] lab: Hgb 7.6 MD notified.

## 2015-09-14 ENCOUNTER — Encounter: Payer: Self-pay | Admitting: Family Medicine

## 2015-09-14 MED ORDER — TRIAMCINOLONE ACETONIDE 0.1 % EX OINT: 1.0000 "application " | TOPICAL_OINTMENT | Freq: Two times a day (BID) | CUTANEOUS | Status: AC

## 2016-01-03 ENCOUNTER — Encounter: Payer: Self-pay | Admitting: Family Medicine

## 2016-02-23 IMAGING — CR DG CHEST 2V
2 series · 2 of 2 positions shown · non-contrast
Comparison: None.

CLINICAL DATA: positive ppd

EXAM:
CHEST  2 VIEW

[w chest pa]
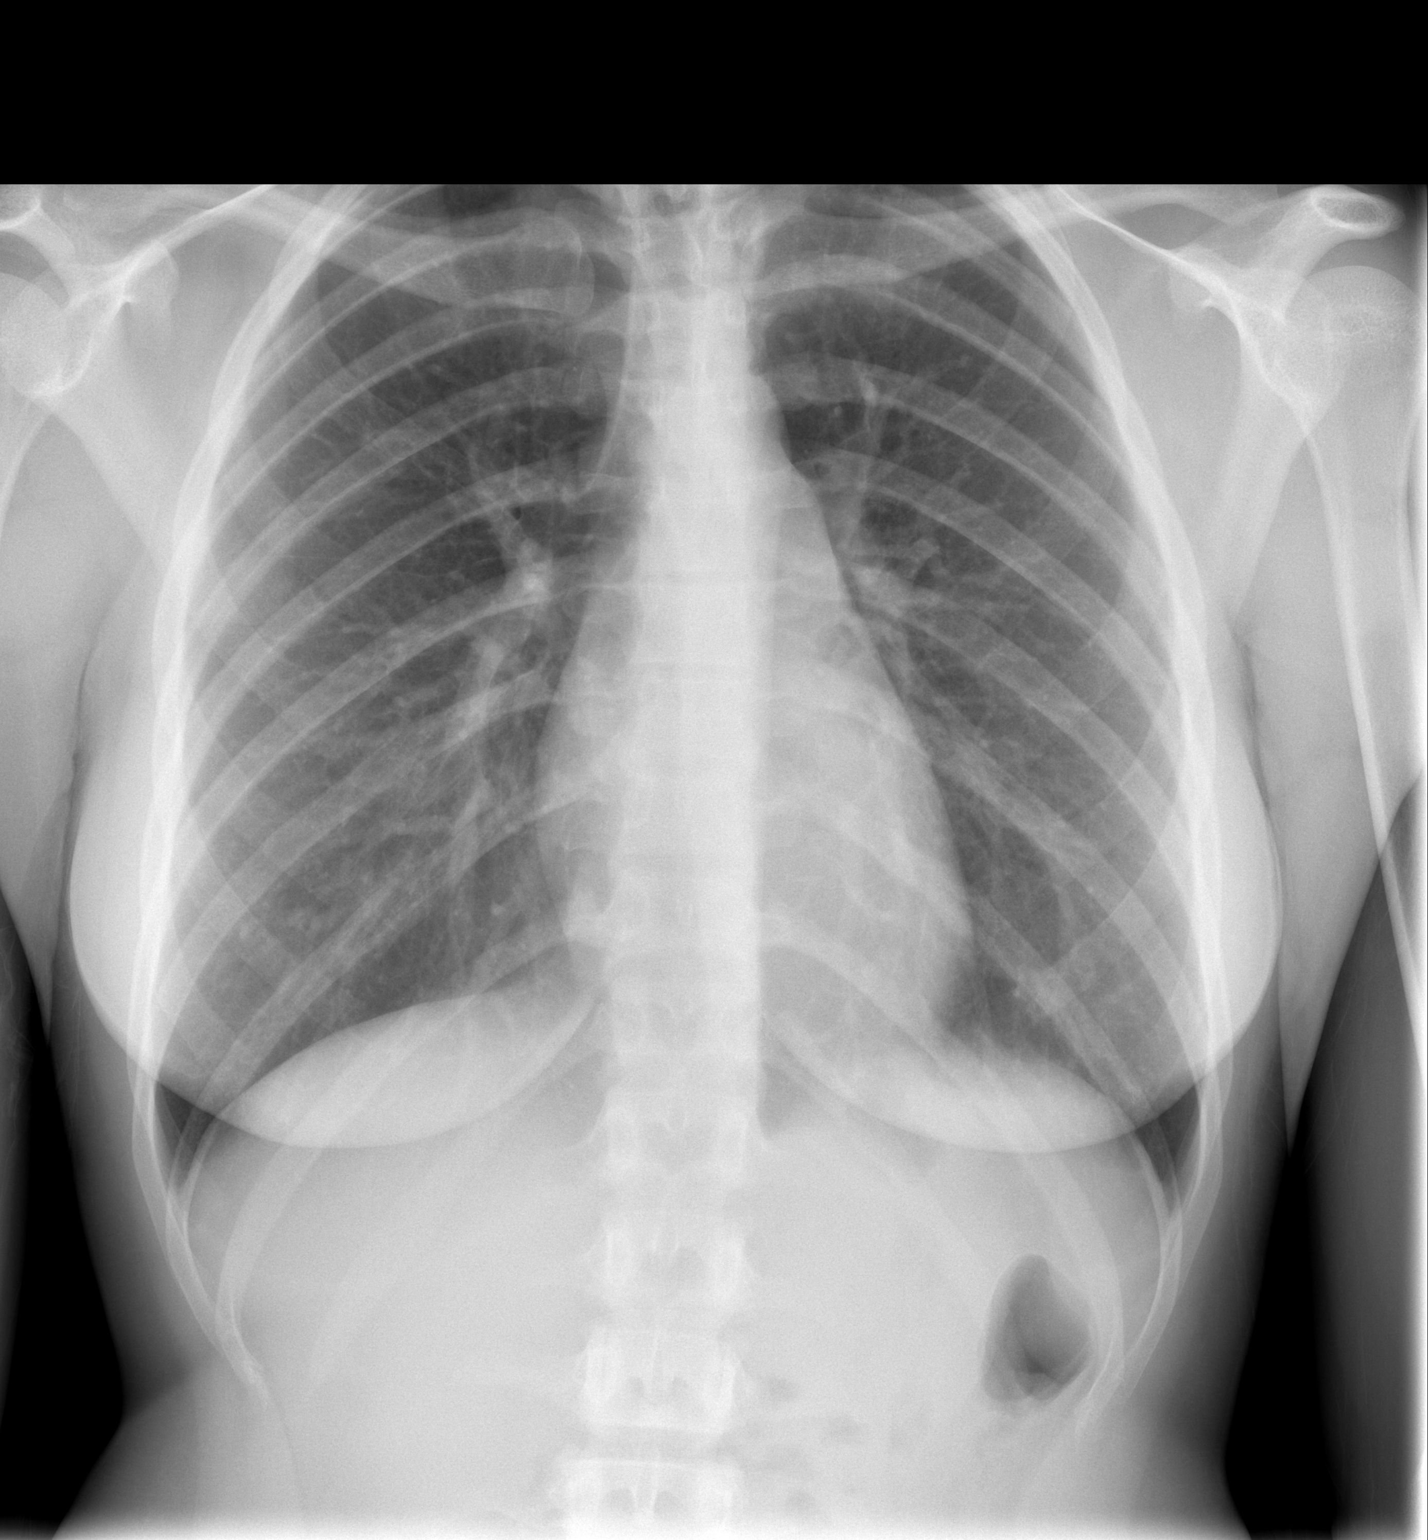

[w chest lat]
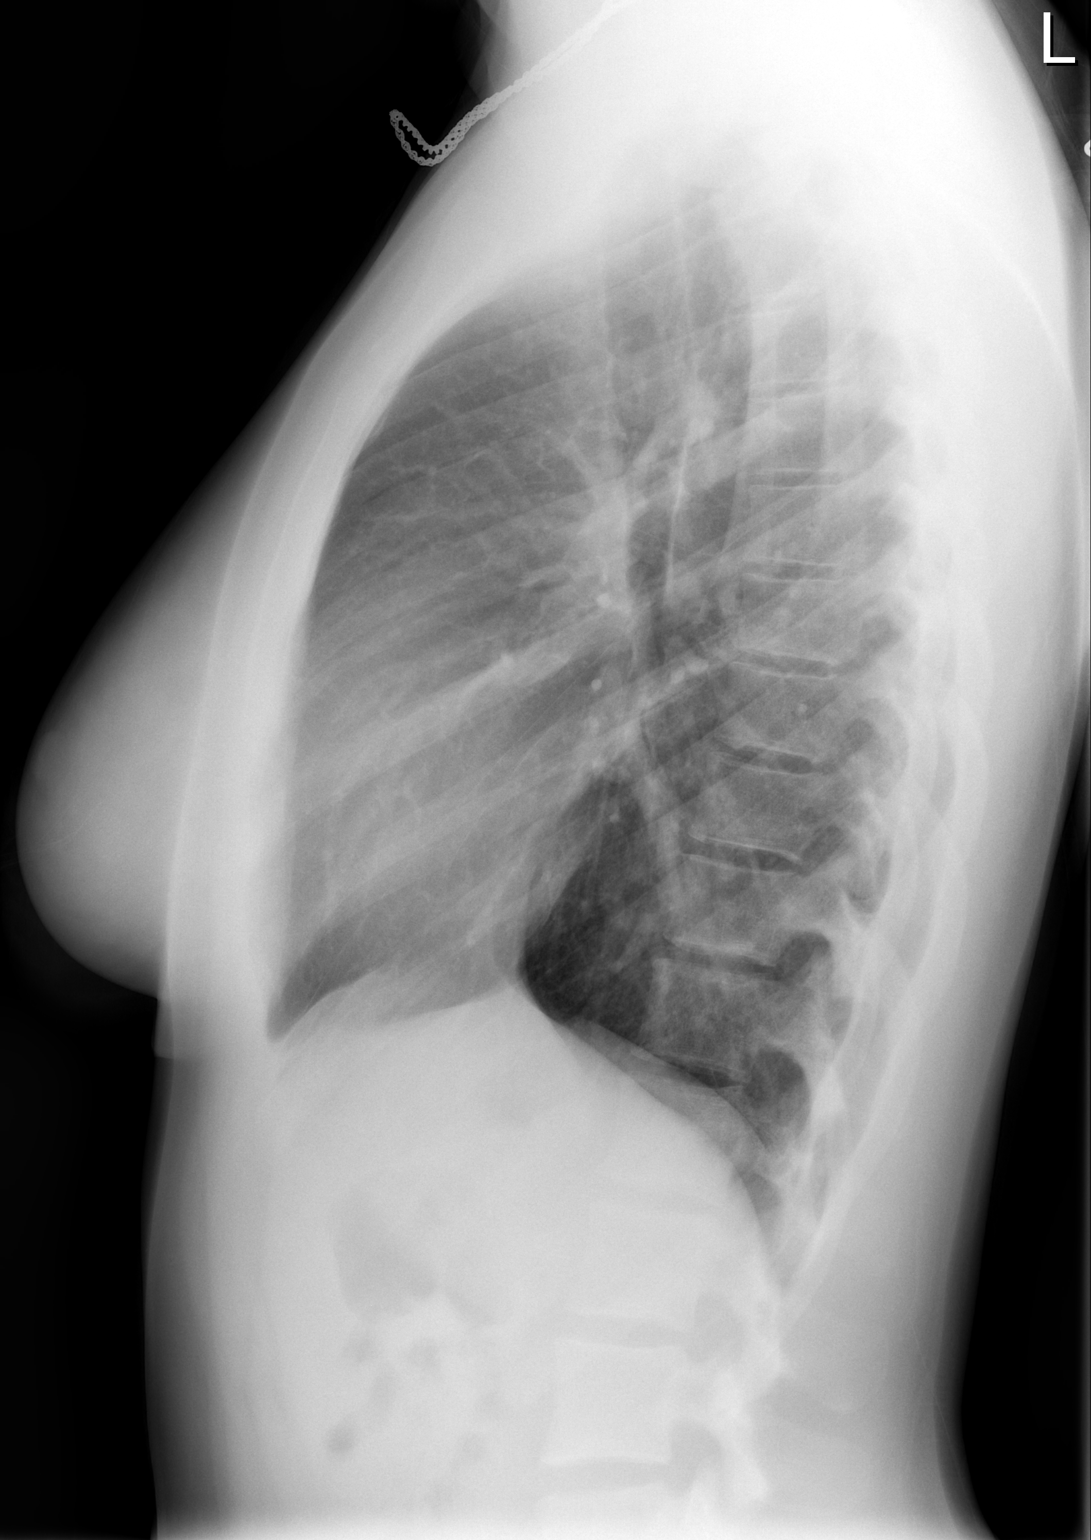

[2 of 2 positions shown; findings below may reference images not displayed]

FINDINGS: The heart size and mediastinal contours are within normal limits.
Both lungs are clear. The visualized skeletal structures are
unremarkable.
IMPRESSION: No active cardiopulmonary disease.

## 2016-08-02 ENCOUNTER — Encounter: Payer: Self-pay | Admitting: Gynecology

## 2017-08-07 ENCOUNTER — Encounter: Payer: Self-pay | Admitting: General Practice
